# Patient Record
Sex: Male | Born: 1960 | Race: White | Hispanic: No
Health system: Southern US, Community
[De-identification: ages and names within clinical notes are randomized; demographics above are authoritative.]

## PROBLEM LIST (undated history)

## (undated) DIAGNOSIS — F419 Anxiety disorder, unspecified: Secondary | ICD-10-CM

---

## 2010-10-07 ENCOUNTER — Emergency Department: Payer: Self-pay | Admitting: Emergency Medicine

## 2010-11-22 ENCOUNTER — Ambulatory Visit: Payer: Self-pay | Admitting: Family Medicine

## 2010-12-13 ENCOUNTER — Ambulatory Visit: Payer: Self-pay | Admitting: Internal Medicine

## 2011-01-24 ENCOUNTER — Ambulatory Visit: Payer: Self-pay | Admitting: Unknown Physician Specialty

## 2011-01-28 LAB — PATHOLOGY REPORT

## 2012-04-16 ENCOUNTER — Ambulatory Visit: Payer: Self-pay | Admitting: Unknown Physician Specialty

## 2012-09-30 IMAGING — CR DG CHEST 2V
1 series · 2 of 2 positions shown · non-contrast
Comparison: none

REASON FOR EXAM: chest pain for 2 days
COMMENTS:   May transport without cardiac monitor

PROCEDURE:     DXR - DXR CHEST PA (OR AP) AND LATERAL  - October 07, 2010  [DATE]
RESULT:     The lung fields are clear. The heart, mediastinal and osseous
structures show no significant abnormalities. Monitoring electrodes are
present.

[Series 1: view not recorded · 0.17mm/px · 2 of 2 slices shown]
[im 1/2]
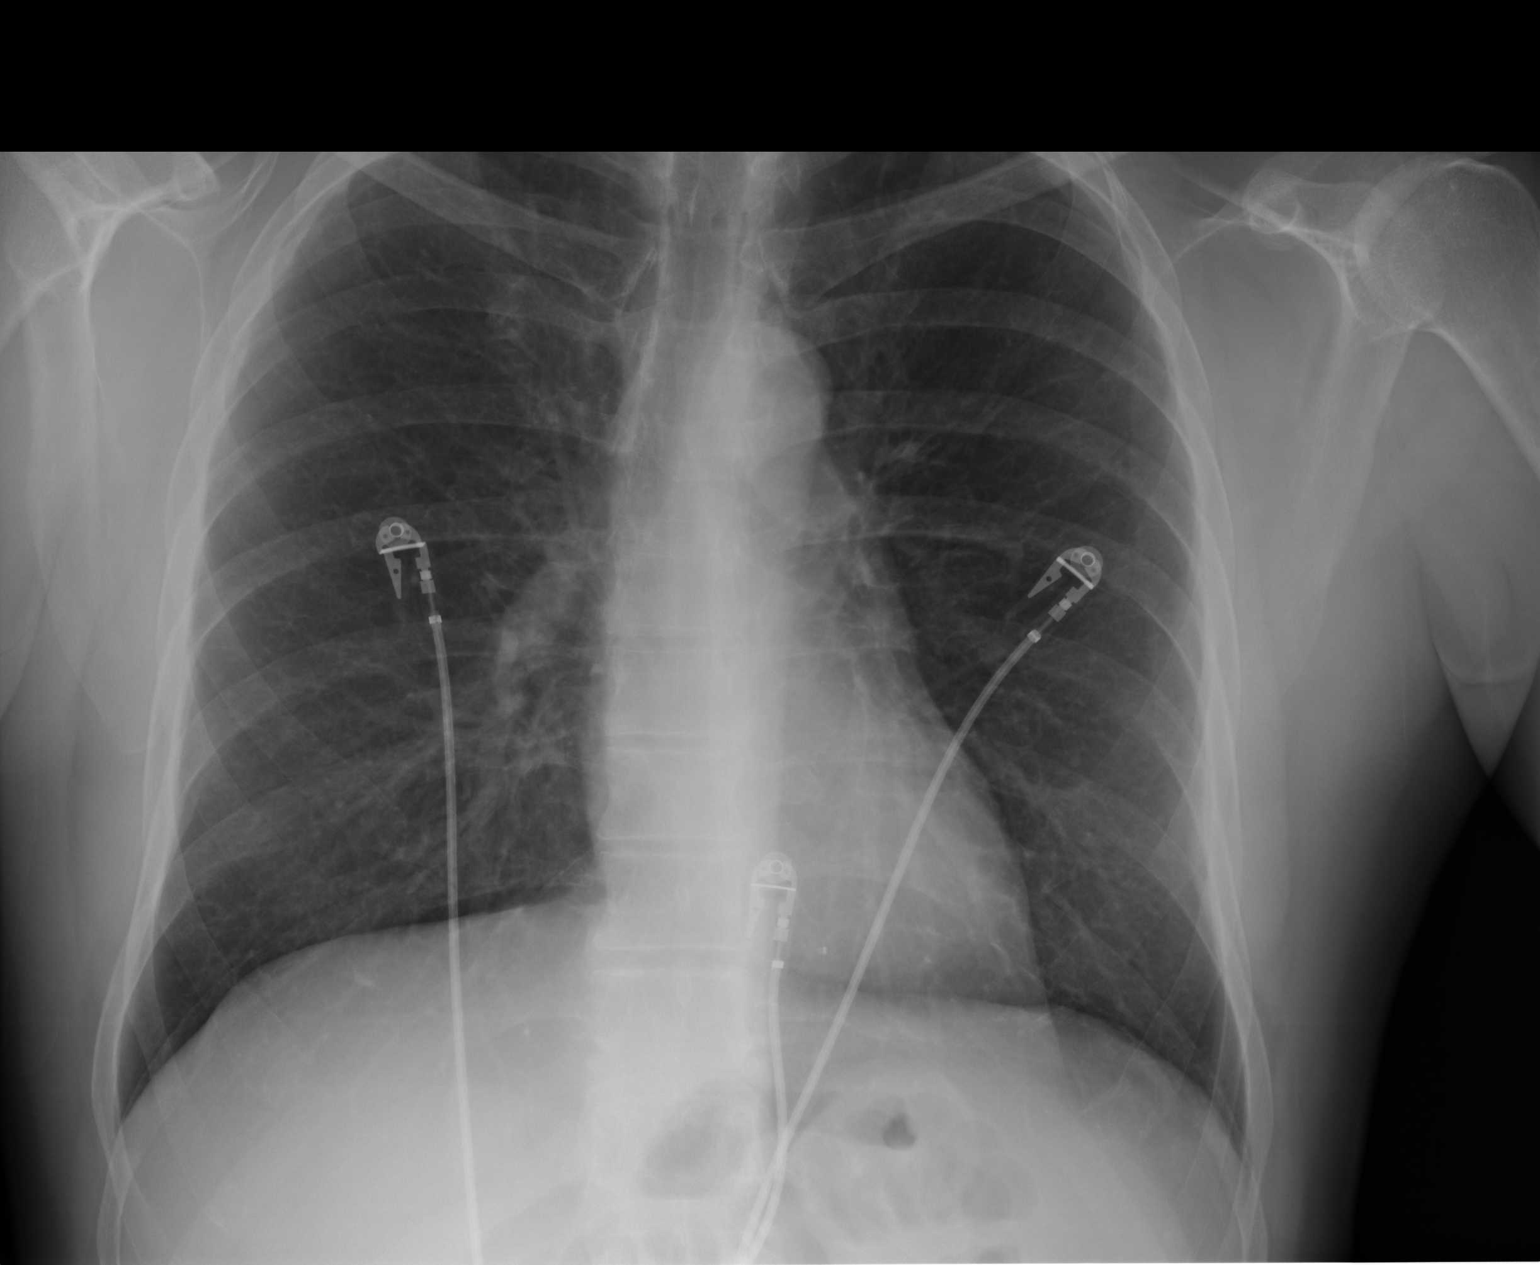
[im 2/2]
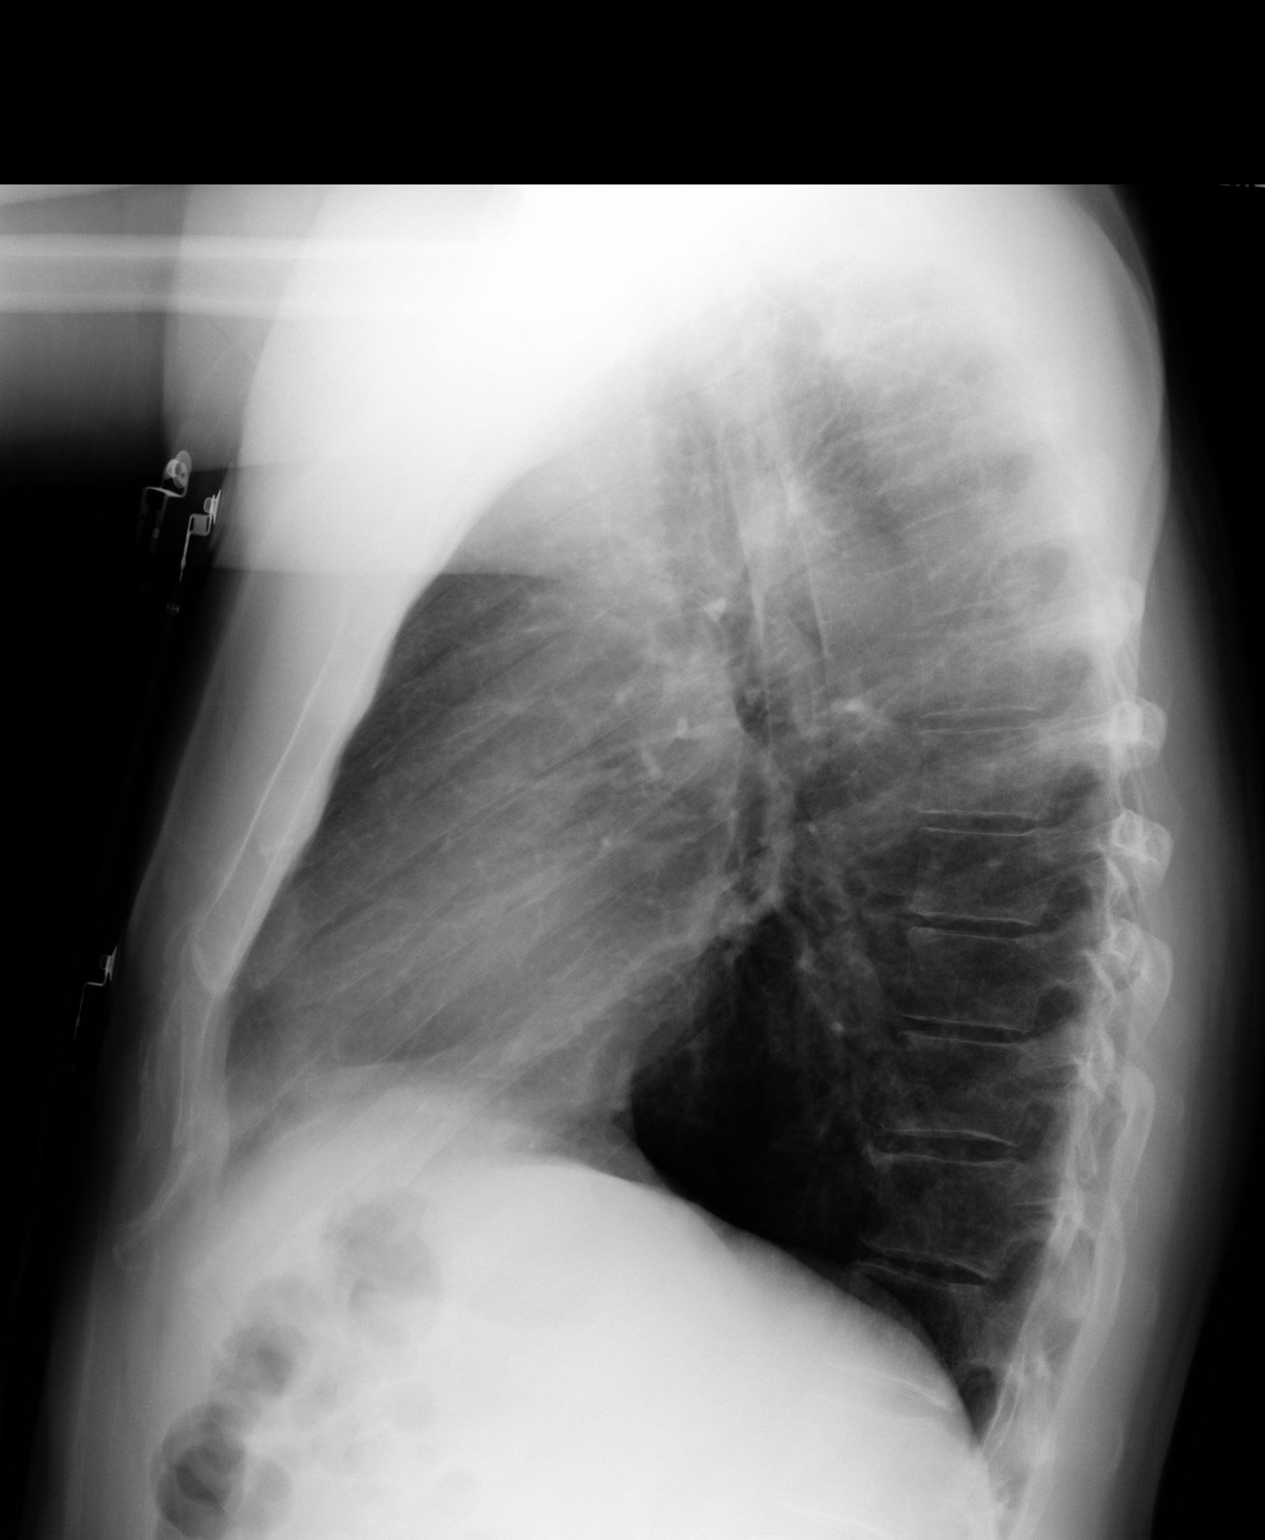

[2 of 2 positions shown; findings below may reference images not displayed]

IMPRESSION: No acute changes are identified.

## 2014-04-10 IMAGING — RF DG UGI W/ KUB
1 series · 2 of 2 positions shown · non-contrast
Comparison: none

REASON FOR EXAM: WITH Tablet  chronic gastritis Reflux
COMMENTS:

[Series 1: fluoro_barium 2fps_bw · 0.17mm/px · 2 of 2 frames shown]
[frame 1/2]
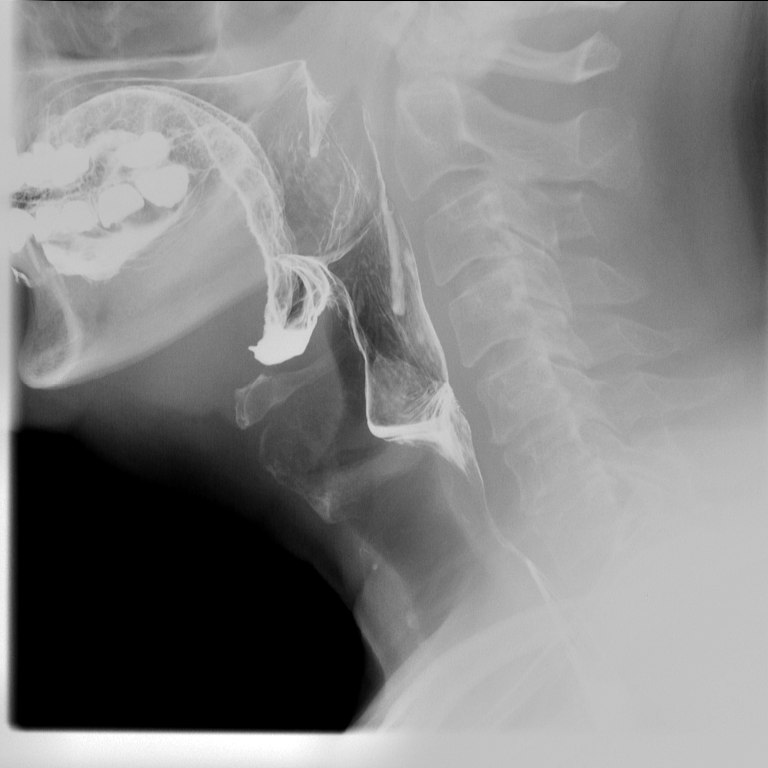
[frame 2/2]
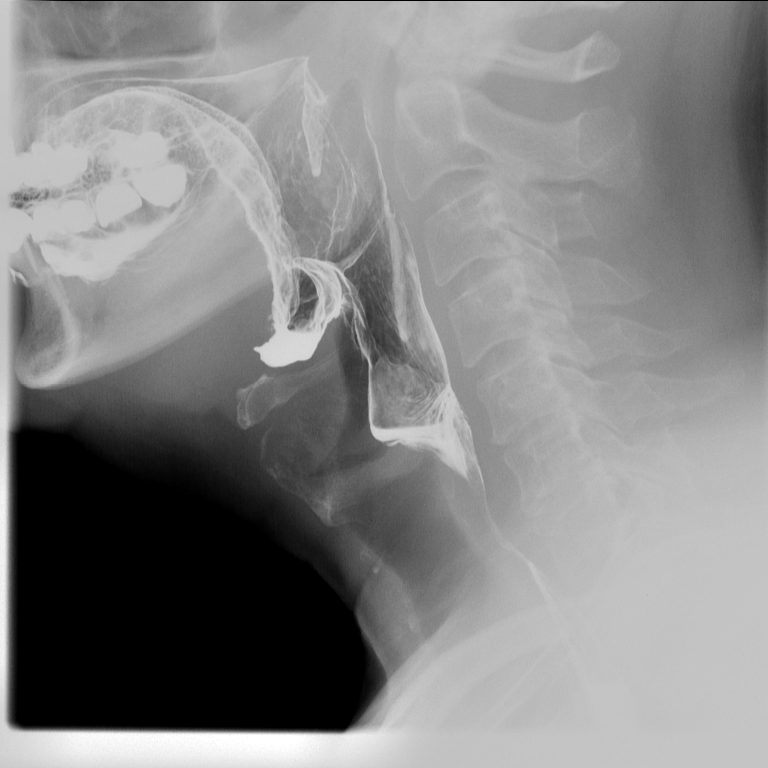

[2 of 2 positions shown; findings below may reference images not displayed]

PROCEDURE:     FL  - FL UPPER GI W/ BARIUM SWALLOW  - April 16, 2012  [DATE]

RESULT:     The anticipated procedure was discussed with the patient. He
voiced his willingness to proceed. The patient ingested thick and thin
barium without difficulty. There was no laryngeal penetration of the barium.
The cervical esophagus distended well. The thoracic esophagus also distended
well. There was no evidence of a fixed stricture nor of esophagitis. A small
reducible hiatal hernia was demonstrated. No gastroesophageal reflux was
elicited.

The stomach distended well. The mucosal fold patterns appeared normal. The
duodenal bulb and C-sweep were normal in appearance. A 12 mm barium pill
passed promptly into the stomach.
IMPRESSION: 1. I see no evidence of esophagitis nor gastroesophageal reflux. There is no
stricture of the esophagus.
2. The stomach and duodenal bulb exhibit no acute abnormality.

## 2022-01-16 ENCOUNTER — Other Ambulatory Visit: Payer: Self-pay

## 2022-01-16 ENCOUNTER — Emergency Department
Admission: EM | Admit: 2022-01-16 | Discharge: 2022-01-16 | Disposition: A | Discharge status: Home / Self Care | Payer: 59 | Attending: Emergency Medicine | Admitting: Emergency Medicine

## 2022-01-16 DIAGNOSIS — R197 Diarrhea, unspecified: Secondary | ICD-10-CM | POA: Diagnosis present

## 2022-01-16 DIAGNOSIS — K580 Irritable bowel syndrome with diarrhea: Secondary | ICD-10-CM | POA: Insufficient documentation

## 2022-01-16 HISTORY — DX: Anxiety disorder, unspecified: F41.9

## 2022-01-16 LAB — COMPREHENSIVE METABOLIC PANEL
ALT: 24 U/L (ref 0–44)
AST: 27 U/L (ref 15–41)
Albumin: 4.8 g/dL (ref 3.5–5.0)
Alkaline Phosphatase: 62 U/L (ref 38–126)
Anion gap: 11 (ref 5–15)
BUN: 18 mg/dL (ref 8–23)
CO2: 25 mmol/L (ref 22–32)
Calcium: 9.4 mg/dL (ref 8.9–10.3)
Chloride: 97 mmol/L — ABNORMAL LOW (ref 98–111)
Creatinine, Ser: 1.44 mg/dL — ABNORMAL HIGH (ref 0.61–1.24)
GFR, Estimated: 55 mL/min — ABNORMAL LOW (ref >60)
Glucose, Bld: 137 mg/dL — ABNORMAL HIGH (ref 70–99)
Potassium: 3.7 mmol/L (ref 3.5–5.1)
Sodium: 133 mmol/L — ABNORMAL LOW (ref 135–145)
Total Bilirubin: 1.3 mg/dL — ABNORMAL HIGH (ref 0.3–1.2)
Total Protein: 8 g/dL (ref 6.5–8.1)

## 2022-01-16 LAB — CBC WITH DIFFERENTIAL/PLATELET
Abs Immature Granulocytes: 0.02 10*3/uL (ref 0.00–0.07)
Basophils Absolute: 0.1 10*3/uL (ref 0.0–0.1)
Basophils Relative: 1 %
Eosinophils Absolute: 0.1 10*3/uL (ref 0.0–0.5)
Eosinophils Relative: 1 %
HCT: 49.2 % (ref 39.0–52.0)
Hemoglobin: 16.6 g/dL (ref 13.0–17.0)
Immature Granulocytes: 0 %
Lymphocytes Relative: 26 %
Lymphs Abs: 2 10*3/uL (ref 0.7–4.0)
MCH: 32.9 pg (ref 26.0–34.0)
MCHC: 33.7 g/dL (ref 30.0–36.0)
MCV: 97.6 fL (ref 80.0–100.0)
Monocytes Absolute: 0.4 10*3/uL (ref 0.1–1.0)
Monocytes Relative: 6 %
Neutro Abs: 5.1 10*3/uL (ref 1.7–7.7)
Neutrophils Relative %: 66 %
Platelets: 279 10*3/uL (ref 150–400)
RBC: 5.04 MIL/uL (ref 4.22–5.81)
RDW: 12.1 % (ref 11.5–15.5)
WBC: 7.6 10*3/uL (ref 4.0–10.5)
nRBC: 0 % (ref 0.0–0.2)

## 2022-01-16 MED ORDER — LACTATED RINGERS IV BOLUS
1000.0000 mL | Freq: Once | INTRAVENOUS | Status: AC
  Administered 2022-01-16: 1000 mL via INTRAVENOUS

## 2022-01-16 MED ORDER — ONDANSETRON 4 MG PO TBDP: 4.0000 mg | ORAL_TABLET | Freq: Three times a day (TID) | ORAL | 0 refills | Status: AC | PRN

## 2022-01-16 NOTE — ED Provider Notes (Signed)
? ?[facility] ?Provider Note ? ? ? Event Date/Time  ? First MD Initiated Contact with Patient 01/16/22 0902   ?  (approximate) ? ? ?History  ? ?Diarrhea ? ? ?HPI ? ?Henry West is a 61 y.o. male who presents to the ED for evaluation of Diarrhea ?  ?I reviewed PCP visit from 2015.  No more recent history in the chart.  History of anxiety and gastritis.  Patient self-reports a history of IBS-D for which she does not take any medications.  Reports last colonoscopy was at least 5+ years ago. ? ?He presents to the ED for evaluation of anxiety and trembling to his bilateral upper arms in the setting of acute on chronic diarrhea.  He reports months of diarrhea, watery stool where "everything I eat goes right through me."  Denies any hematochezia, melena.  Reports some chronic cramping without any worsening abdominal pain.  Denies chest pain emesis, fever, dysuria.  Denies dizziness or syncopal episodes. ? ? ?Physical Exam  ? ?Triage Vital Signs: ?ED Triage Vitals  ?Enc Vitals Group  ?   BP   ?   Pulse   ?   Resp   ?   Temp   ?   Temp src   ?   SpO2   ?   Weight   ?   Height   ?   Head Circumference   ?   Peak Flow   ?   Pain Score   ?   Pain Loc   ?   Pain Edu?   ?   Excl. in GC?   ? ? ?Most recent vital signs: ?Vitals:  ? 01/16/22 0904 01/16/22 0915  ?BP: (!) 183/79 (!) 146/95  ?Pulse: 88 80  ?Resp: 16 17  ?Temp: 99.2 ?F (37.3 ?C)   ?SpO2: 96% 96%  ? ? ?General: Awake, no distress.  Pleasant and conversational full sentences.  Does seem somewhat anxious. ?CV:  Good peripheral perfusion.  ?Resp:  Normal effort.  ?Abd:  No distention.  Soft and benign throughout ?MSK:  No deformity noted.  ?Neuro:  No focal deficits appreciated. Cranial nerves II through XII intact ?5/5 strength and sensation in all 4 extremities ?Other:   ? ? ?ED Results / Procedures / Treatments  ? ?Labs ?(all labs ordered are listed, but only abnormal results are displayed) ?Labs Reviewed  ?COMPREHENSIVE METABOLIC PANEL -  Abnormal; Notable for the following components:  ?    Result Value  ? Sodium 133 (*)   ? Chloride 97 (*)   ? Glucose, Bld 137 (*)   ? Creatinine, Ser 1.44 (*)   ? Total Bilirubin 1.3 (*)   ? GFR, Estimated 55 (*)   ? All other components within normal limits  ?CBC WITH DIFFERENTIAL/PLATELET  ? ? ?EKG ?Sinus rhythm, rate of 81 bpm.  Normal axis.  Incomplete right bundle and otherwise normal intervals.  No evidence of acute ischemia. ? ?RADIOLOGY ? ?Official radiology report(s): ?No results found. ? ?PROCEDURES and INTERVENTIONS: ? ?.1-3 Lead EKG Interpretation ?Performed by: Delton Prairie, MD ?Authorized by: Delton Prairie, MD  ? ?  Interpretation: normal   ?  ECG rate:  78 ?  ECG rate assessment: normal   ?  Rhythm: sinus rhythm   ?  Ectopy: none   ?  Conduction: normal   ? ?Medications  ?lactated ringers bolus 1,000 mL (1,000 mLs Intravenous Bolus 01/16/22 0911)  ? ? ? ?IMPRESSION / MDM / ASSESSMENT AND PLAN /  ED COURSE  ?I reviewed the triage vital signs and the nursing notes. ? ?61 year old male presents to the ED with chronic diarrhea suitable for outpatient management with following up with GI.  He looks clinically well to me and has normal vital signs on room air.  Benign abdominal examination without any guarding or peritoneal features.  Blood work with marginal hyponatremia.  Normal CBC.  EKG is nonischemic.  I see no indications for further diagnostics here in the ED.  He is tolerating p.o. intake.  We will discharge with a prescription for Zofran and referral to GI. ? ?Clinical Course as of 01/16/22 1007  ?Wed Jan 16, 2022  ?0945 Discussed mild electrolyte derangements.  Discussed IBS as the likely etiology considering the chronicity of his symptoms.  Discussed following up with GI, Zofran at home and appropriate return precautions for the ED. [DS]  ?  ?Clinical Course User Index ?[DS] Delton Prairie, MD  ? ? ? ?FINAL CLINICAL IMPRESSION(S) / ED DIAGNOSES  ? ?Final diagnoses:  ?Irritable bowel syndrome with  diarrhea  ? ? ? ?Rx / DC Orders  ? ?ED Discharge Orders   ? ?      Ordered  ?  ondansetron (ZOFRAN-ODT) 4 MG disintegrating tablet  Every 8 hours PRN       ? 01/16/22 3419  ? ?  ?  ? ?  ? ? ? ?Note:  This document was prepared using Dragon voice recognition software and may include unintentional dictation errors. ?  ?Delton Prairie, MD ?01/16/22 1008 ? ?

## 2022-01-16 NOTE — ED Triage Notes (Signed)
Pt presents to ED with c/o of diarrhea for the past month, pt states HX IBS-D. Pt states he does not take medications for this. Pt states also HX of anxiety and states feeling more anxious lately but states no medications have worked for him at this time for this. Pt denies fever or chills. Pt states he has noticed he has been shaking and that's what warranted the EMS call.  ? ?Pt denies urinary symptoms, pt denies any SOB or CP. NAD noted. Pt is A&Ox4.  ?

## 2022-01-16 NOTE — Discharge Instructions (Addendum)
Please use the Zofran as needed moving forward to help with your stomach cramping and diarrhea. ? ?Please follow-up with the GI doctor in the clinic to discuss IBS and diarrhea. ?

## 2022-01-16 NOTE — ED Notes (Signed)
D/C, new RX and reasons to return to ED discussed with pt and S/O, both verbalized understanding. NAD noted. Pt ambulatory with steady gait on D/C, VSS.  ?

## 2022-03-07 ENCOUNTER — Ambulatory Visit: Payer: Self-pay | Admitting: Gastroenterology

## 2022-10-31 ENCOUNTER — Other Ambulatory Visit: Payer: Self-pay | Admitting: Family Medicine

## 2022-10-31 DIAGNOSIS — E119 Type 2 diabetes mellitus without complications: Secondary | ICD-10-CM

## 2022-10-31 DIAGNOSIS — Z9189 Other specified personal risk factors, not elsewhere classified: Secondary | ICD-10-CM

## 2022-11-11 ENCOUNTER — Ambulatory Visit
Admission: RE | Admit: 2022-11-11 | Discharge: 2022-11-11 | Disposition: A | Discharge status: Home / Self Care | Payer: 59 | Source: Ambulatory Visit | Attending: Family Medicine | Admitting: Family Medicine

## 2022-11-11 DIAGNOSIS — E119 Type 2 diabetes mellitus without complications: Secondary | ICD-10-CM | POA: Insufficient documentation

## 2022-11-11 DIAGNOSIS — Z9189 Other specified personal risk factors, not elsewhere classified: Secondary | ICD-10-CM | POA: Insufficient documentation

## 2023-08-14 DIAGNOSIS — E782 Mixed hyperlipidemia: Secondary | ICD-10-CM | POA: Diagnosis not present

## 2023-08-14 DIAGNOSIS — E785 Hyperlipidemia, unspecified: Secondary | ICD-10-CM | POA: Diagnosis not present

## 2023-08-14 DIAGNOSIS — E1169 Type 2 diabetes mellitus with other specified complication: Secondary | ICD-10-CM | POA: Diagnosis not present

## 2023-08-21 DIAGNOSIS — E785 Hyperlipidemia, unspecified: Secondary | ICD-10-CM | POA: Diagnosis not present

## 2023-08-21 DIAGNOSIS — Z125 Encounter for screening for malignant neoplasm of prostate: Secondary | ICD-10-CM | POA: Diagnosis not present

## 2023-08-21 DIAGNOSIS — E1169 Type 2 diabetes mellitus with other specified complication: Secondary | ICD-10-CM | POA: Diagnosis not present

## 2023-08-21 DIAGNOSIS — Z23 Encounter for immunization: Secondary | ICD-10-CM | POA: Diagnosis not present

## 2023-08-21 DIAGNOSIS — R030 Elevated blood-pressure reading, without diagnosis of hypertension: Secondary | ICD-10-CM | POA: Diagnosis not present

## 2023-08-21 DIAGNOSIS — E782 Mixed hyperlipidemia: Secondary | ICD-10-CM | POA: Diagnosis not present
